# Patient Record
Sex: Female | Born: 1995 | Race: White | Hispanic: No | Marital: Single | Smoking: Never smoker
Health system: Southern US, Community
[De-identification: ages and names within clinical notes are randomized; demographics above are authoritative.]

## PROBLEM LIST (undated history)

## (undated) HISTORY — PX: WISDOM TOOTH EXTRACTION: SHX21

---

## 2014-02-13 ENCOUNTER — Emergency Department (HOSPITAL_BASED_OUTPATIENT_CLINIC_OR_DEPARTMENT_OTHER)
Admission: EM | Admit: 2014-02-13 | Discharge: 2014-02-13 | Disposition: A | Discharge status: Home / Self Care | Payer: No Typology Code available for payment source | Attending: Emergency Medicine | Admitting: Emergency Medicine

## 2014-02-13 ENCOUNTER — Encounter (HOSPITAL_BASED_OUTPATIENT_CLINIC_OR_DEPARTMENT_OTHER): Payer: Self-pay | Admitting: Emergency Medicine

## 2014-02-13 DIAGNOSIS — Y929 Unspecified place or not applicable: Secondary | ICD-10-CM | POA: Insufficient documentation

## 2014-02-13 DIAGNOSIS — T192XXA Foreign body in vulva and vagina, initial encounter: Secondary | ICD-10-CM | POA: Diagnosis not present

## 2014-02-13 DIAGNOSIS — Y939 Activity, unspecified: Secondary | ICD-10-CM | POA: Insufficient documentation

## 2014-02-13 DIAGNOSIS — IMO0002 Reserved for concepts with insufficient information to code with codable children: Secondary | ICD-10-CM | POA: Insufficient documentation

## 2014-02-13 NOTE — ED Provider Notes (Signed)
CSN: [id number]     Arrival date & time 02/13/14  1234 History   First MD Initiated Contact with Patient 02/13/14 1343     Chief Complaint  Patient presents with  . Foreign Body in Vagina     (Consider location/radiation/quality/duration/timing/severity/associated sxs/prior Treatment) Patient is a 18 y.o. female presenting with foreign body in vagina. The history is provided by the patient. No language interpreter was used.  Foreign Body in Vagina This is a new problem. The current episode started today. The problem occurs constantly. The problem has been gradually worsening. Pertinent negatives include no fever. Nothing aggravates the symptoms. She has tried nothing for the symptoms. The treatment provided moderate relief.    History reviewed. No pertinent past medical history. History reviewed. No pertinent past surgical history. No family history on file. History  Substance Use Topics  . Smoking status: Never Smoker   . Smokeless tobacco: Not on file  . Alcohol Use: Not on file   OB History   Grav Para Term Preterm Abortions TAB SAB Ect Mult Living                 Review of Systems  Constitutional: Negative for fever.  Genitourinary: Negative for vaginal bleeding, vaginal discharge and vaginal pain.  All other systems reviewed and are negative.     Allergies  Review of patient's allergies indicates no known allergies.  Home Medications   Prior to Admission medications   Not on File   BP 121/65  Pulse 67  Temp(Src) 98.3 F (36.8 C) (Oral)  Resp 19  Ht  (1.626 m)  Wt 135 lb (61.236 kg)  BMI 23.16 kg/m2  SpO2 100%  LMP 02/13/2014 Physical Exam  Nursing note and vitals reviewed. Constitutional: She is oriented to person, place, and time. She appears well-developed and well-nourished.  HENT:  Head: Normocephalic and atraumatic.  Eyes: Conjunctivae and EOM are normal. Pupils are equal, round, and reactive to light.  Neck: Normal range of motion.   Cardiovascular: Normal rate.   Pulmonary/Chest: Effort normal.  Abdominal: She exhibits no distension.  Genitourinary: No vaginal discharge found.  tampoon vault  Musculoskeletal: Normal range of motion.  Neurological: She is alert and oriented to person, place, and time.  Psychiatric: She has a normal mood and affect.    ED Course  Procedures (including critical care time) Labs Review Labs Reviewed - No data to display  Imaging Review No results found.   EKG Interpretation None      MDM  tmpoon removed with ring forcep   Final diagnoses:  Vaginal foreign body, initial encounter    Return if any problems.    Lonia Skinner Plover, PA-C 02/13/14 1437

## 2014-02-13 NOTE — ED Notes (Signed)
Patient here with tampon stuck in vagina x 1 day, denies pain

## 2014-02-13 NOTE — Discharge Instructions (Signed)
Vaginal Foreign Body °A vaginal foreign body is any object that gets stuck or left inside the vagina. Vaginal foreign bodies left in the vagina for a long time can cause irritation and infection. In most cases, symptoms go away once the vaginal foreign body is found and removed. Rarely, a foreign object can break through the walls of the vagina and cause a serious infection inside the abdomen.  °CAUSES  °The most common vaginal foreign bodies are: °· Tampons. °· Contraceptive devices. °· Toilet tissue left in the vagina. °· Small objects that were placed in the vagina out of curiosity and got stuck. °· A result of sexual abuse.  °SIGNS AND SYMPTOMS °· Light vaginal bleeding. °· Blood-tinged vaginal fluid (discharge). °· Vaginal discharge that smells bad. °· Vaginal itching or burning. °· Redness, swelling, or rash near the opening of the vagina. °· Abdominal pain. °· Fever. °· Burning or frequent urination. °DIAGNOSIS  °Your health care provider may be able to diagnose a vaginal foreign body based on the information you provide, your symptoms, and a physical exam. Your health care provider may also perform the following tests to check for infection: °· A swab of the discharge to check under a microscope for bacteria (culture). °· A urine culture. °· An examination of the vagina with a small, lighted scope (vaginoscopy). °· Imaging tests to get a picture of the inside of your vagina, such as: °¨ Ultrasound. °¨ X-ray. °¨ MRI. °TREATMENT  °In most cases, a vaginal foreign body can be easily removed and the symptoms usually go away very quickly. Other treatment may include:  °· If the vaginal foreign body is not easily removed, medicine may be given to make you go to sleep (general anesthesia) to have the object removed. °· Emergency surgery may be necessary if an infection spreads through the walls of the vagina into the abdomen (acute abdomen). This is rare. °· You may need to take antibiotic medicine if you have a  vaginal or urinary tract infection. °HOME CARE INSTRUCTIONS  °· Take medicines only as directed by your health care provider. °· If you were prescribed an antibiotic medicine, finish it all even if you start to feel better. °· Do not have sex or use tampons until your health care provider says it is okay. °· Do not douche or use vaginal rinses unless your health care provider recommends it. °· Keep all follow-up visits as directed by your health care provider. This is important. °SEEK MEDICAL CARE IF: °· You have abdominal pain or burning pain when urinating. °· You have a fever. °SEEK IMMEDIATE MEDICAL CARE IF: °· You have heavy vaginal bleeding or discharge.   °· You have very bad abdominal pain.   °MAKE SURE YOU: °· Understand these instructions. °· Will watch your condition. °· Will get help right away if you are not doing well or get worse. °Document Released: 10/05/2013 Document Reviewed: 03/20/2013 °[employer]® Patient Information ©2015 [employer]. This information is not intended to replace advice given to you by your health care provider. Make sure you discuss any questions you have with your health care provider. ° °

## 2014-02-13 NOTE — ED Provider Notes (Signed)
Medical screening examination/treatment/procedure(s) were performed by non-physician practitioner and as supervising physician I was immediately available for consultation/collaboration.   EKG Interpretation None       Maryjayne Kleven, MD 02/13/14 1520 

## 2014-02-13 NOTE — ED Notes (Signed)
Pt discharged to home with family. NAD.  

## 2016-08-25 ENCOUNTER — Emergency Department (HOSPITAL_BASED_OUTPATIENT_CLINIC_OR_DEPARTMENT_OTHER)
Admission: EM | Admit: 2016-08-25 | Discharge: 2016-08-25 | Disposition: A | Discharge status: Home / Self Care | Payer: BLUE CROSS/BLUE SHIELD | Attending: Emergency Medicine | Admitting: Emergency Medicine

## 2016-08-25 ENCOUNTER — Encounter (HOSPITAL_BASED_OUTPATIENT_CLINIC_OR_DEPARTMENT_OTHER): Payer: Self-pay | Admitting: Emergency Medicine

## 2016-08-25 DIAGNOSIS — Y9339 Activity, other involving climbing, rappelling and jumping off: Secondary | ICD-10-CM | POA: Diagnosis not present

## 2016-08-25 DIAGNOSIS — Y999 Unspecified external cause status: Secondary | ICD-10-CM | POA: Diagnosis not present

## 2016-08-25 DIAGNOSIS — W1789XA Other fall from one level to another, initial encounter: Secondary | ICD-10-CM | POA: Diagnosis not present

## 2016-08-25 DIAGNOSIS — Y929 Unspecified place or not applicable: Secondary | ICD-10-CM | POA: Insufficient documentation

## 2016-08-25 DIAGNOSIS — S0121XA Laceration without foreign body of nose, initial encounter: Secondary | ICD-10-CM | POA: Diagnosis present

## 2016-08-25 DIAGNOSIS — R040 Epistaxis: Secondary | ICD-10-CM

## 2016-08-25 NOTE — ED Provider Notes (Signed)
[facility]   CSN: [id number] Arrival date & time: 08/25/16  1345     History   Chief Complaint Chief Complaint  Patient presents with  . Facial Injury    HPI Rita Jenkins is a 21 y.o. female.  21 yo F with a chief complaint of pain to her nose. Patient was trying to climb a fence and fell and landed on her face. Denies neck pain denies loss consciousness denies confusion denies vomiting. Had some epistaxis that improved with direct pressure. Also is having a wound to her nose itself. Bleeding controlled with pressure.   The history is provided by the patient.  Facial Injury  Mechanism of injury:  Fall Location:  Nose Time since incident:  2 hours Pain details:    Quality:  Aching, sharp and burning   Severity:  Mild   Duration:  2 hours   Timing:  Constant   Progression:  Unchanged Foreign body present:  No foreign bodies Relieved by:  Nothing Worsened by:  Nothing Ineffective treatments:  None tried Associated symptoms: epistaxis   Associated symptoms: no congestion, no headaches, no nausea, no rhinorrhea, no vomiting and no wheezing     History reviewed. No pertinent past medical history.  There are no active problems to display for this patient.   Past Surgical History:  Procedure Laterality Date  . WISDOM TOOTH EXTRACTION      OB History    No data available       Home Medications    Prior to Admission medications   Medication Sig Start Date End Date Taking? Authorizing Provider  desogestrel-ethinyl estradiol (APRI,EMOQUETTE,SOLIA) 0.15-30 MG-MCG tablet Take 1 tablet by mouth daily.   Yes Historical Provider, MD    Family History No family history on file.  Social History Social History  Substance Use Topics  . Smoking status: Never Smoker  . Smokeless tobacco: Never Used  . Alcohol use No     Allergies   Patient has no known allergies.   Review of Systems Review of Systems  Constitutional: Negative for  chills and fever.  HENT: Positive for nosebleeds. Negative for congestion and rhinorrhea.   Eyes: Negative for redness and visual disturbance.  Respiratory: Negative for shortness of breath and wheezing.   Cardiovascular: Negative for chest pain and palpitations.  Gastrointestinal: Negative for nausea and vomiting.  Genitourinary: Negative for dysuria and urgency.  Musculoskeletal: Negative for arthralgias and myalgias.  Skin: Positive for wound. Negative for pallor.  Neurological: Negative for dizziness and headaches.     Physical Exam Updated Vital Signs BP 124/82 (BP Location: Left Arm)   Pulse 92   Temp 98.3 F (36.8 C) (Oral)   Resp 18   Ht 5\' 4"  (1.626 m)   Wt 125 lb (56.7 kg)   LMP 08/18/2016 (Approximate)   SpO2 100%   BMI 21.46 kg/m   Physical Exam  Constitutional: She is oriented to person, place, and time. She appears well-developed and well-nourished. No distress.  HENT:  Head: Normocephalic and atraumatic.  Superficial linear laceration 2.6 cm on the right nasal bridge. Trace blood to the anterior medial aspect of the left nare  Eyes: EOM are normal. Pupils are equal, round, and reactive to light.  Neck: Normal range of motion. Neck supple.  Cardiovascular: Normal rate and regular rhythm.  Exam reveals no gallop and no friction rub.   No murmur heard. Pulmonary/Chest: Effort normal. She has no wheezes. She has no rales.  Abdominal: Soft. She  exhibits no distension and no mass. There is no tenderness. There is no guarding.  Musculoskeletal: She exhibits no edema or tenderness.  Neurological: She is alert and oriented to person, place, and time.  Skin: Skin is warm and dry. She is not diaphoretic.  Psychiatric: She has a normal mood and affect. Her behavior is normal.  Nursing note and vitals reviewed.    ED Treatments / Results  Labs (all labs ordered are listed, but only abnormal results are displayed) Labs Reviewed - No data to display  EKG  EKG  Interpretation None       Radiology No results found.  Procedures .Marland KitchenLaceration Repair Date/Time: 08/25/2016 2:39 PM Performed by: Adela Lank Breon Rehm Authorized by: Melene Plan   Consent:    Consent obtained:  Verbal   Consent given by:  Patient   Risks discussed:  Infection, pain, poor cosmetic result and poor wound healing   Alternatives discussed:  No treatment, delayed treatment and observation Anesthesia (see MAR for exact dosages):    Anesthesia method:  None Laceration details:    Location:  Face   Face location:  Nose   Length (cm):  2.7 Repair type:    Repair type:  Simple Exploration:    Wound exploration: wound explored through full range of motion     Contaminated: no   Treatment:    Area cleansed with:  Soap and water   Amount of cleaning:  Standard Skin repair:    Repair method:  Tissue adhesive Approximation:    Approximation:  Close Post-procedure details:    Dressing:  Open (no dressing)   Patient tolerance of procedure:  Tolerated well, no immediate complications   (including critical care time)  Medications Ordered in ED Medications - No data to display   Initial Impression / Assessment and Plan / ED Course  I have reviewed the triage vital signs and the nursing notes.  Pertinent labs & imaging results that were available during my care of the patient were reviewed by me and considered in my medical decision making (see chart for details).     21 yo F With a laceration to her nose. Superficial in nature repaired with Dermabond. Clinically patient has no signs of fracture of the face. Discussed with the patient off imaging at this time. Patient has some very mild epistaxis that improved with direct pressure. Discharge home.  Final Clinical Impressions(s) / ED Diagnoses   Final diagnoses:  Nasal laceration, initial encounter  Epistaxis    New Prescriptions New Prescriptions   No medications on file     Melene Plan, DO 08/25/16 1442

## 2016-08-25 NOTE — ED Notes (Signed)
ED Provider at bedside. 

## 2016-08-25 NOTE — ED Triage Notes (Signed)
Pt tripped over a baracade approx 1 hour ago and landed on nose, nose bled, bleeing since has stopped.  Pt c/o pain to nose.  Ice pack applied by pt and she took 600mg  ibuprofen at approx 120pm today.

## 2016-08-27 ENCOUNTER — Emergency Department (HOSPITAL_BASED_OUTPATIENT_CLINIC_OR_DEPARTMENT_OTHER): Payer: BLUE CROSS/BLUE SHIELD

## 2016-08-27 ENCOUNTER — Encounter (HOSPITAL_BASED_OUTPATIENT_CLINIC_OR_DEPARTMENT_OTHER): Payer: Self-pay | Admitting: Emergency Medicine

## 2016-08-27 ENCOUNTER — Emergency Department (HOSPITAL_BASED_OUTPATIENT_CLINIC_OR_DEPARTMENT_OTHER)
Admission: EM | Admit: 2016-08-27 | Discharge: 2016-08-27 | Disposition: A | Discharge status: Home / Self Care | Payer: BLUE CROSS/BLUE SHIELD | Attending: Emergency Medicine | Admitting: Emergency Medicine

## 2016-08-27 DIAGNOSIS — S0992XA Unspecified injury of nose, initial encounter: Secondary | ICD-10-CM | POA: Diagnosis present

## 2016-08-27 DIAGNOSIS — S022XXA Fracture of nasal bones, initial encounter for closed fracture: Secondary | ICD-10-CM | POA: Diagnosis not present

## 2016-08-27 DIAGNOSIS — W1809XA Striking against other object with subsequent fall, initial encounter: Secondary | ICD-10-CM | POA: Diagnosis not present

## 2016-08-27 DIAGNOSIS — Y9389 Activity, other specified: Secondary | ICD-10-CM | POA: Insufficient documentation

## 2016-08-27 DIAGNOSIS — S0511XA Contusion of eyeball and orbital tissues, right eye, initial encounter: Secondary | ICD-10-CM | POA: Insufficient documentation

## 2016-08-27 DIAGNOSIS — Y999 Unspecified external cause status: Secondary | ICD-10-CM | POA: Diagnosis not present

## 2016-08-27 DIAGNOSIS — S0512XA Contusion of eyeball and orbital tissues, left eye, initial encounter: Secondary | ICD-10-CM | POA: Insufficient documentation

## 2016-08-27 DIAGNOSIS — Y929 Unspecified place or not applicable: Secondary | ICD-10-CM | POA: Insufficient documentation

## 2016-08-27 NOTE — ED Provider Notes (Signed)
[facility]   CSN: [id number] Arrival date & time: 08/27/16  1907  By signing my name below, I, Rita Jenkins, attest that this documentation has been prepared under the direction and in the presence of Rita CoreNathan Novie Maggio, MD . Electronically Signed: Teofilo PodMatthew P. Jenkins, ED Scribe. 08/27/2016. 8:06 PM.    History   Chief Complaint Chief Complaint  Patient presents with  . Facial Injury    The history is provided by the patient. No language interpreter was used.   HPI Comments:  Rita Jenkins is a 21 y.o. female who presents to the Emergency Department complaining of a wound to her nose that she sustained 3 days ago. 3 days ago, she fell and hit her face on the ground when trying to jump over a fence She states that the pain and swelling has worsened since the fall. Pt was seen here after the fall and had the wound repaired with dermabond. Pt denies any nasal discharge, epistaxis.  History reviewed. No pertinent past medical history.  There are no active problems to display for this patient.   Past Surgical History:  Procedure Laterality Date  . WISDOM TOOTH EXTRACTION      OB History    No data available       Home Medications    Prior to Admission medications   Medication Sig Start Date End Date Taking? Authorizing Provider  desogestrel-ethinyl estradiol (APRI,EMOQUETTE,SOLIA) 0.15-30 MG-MCG tablet Take 1 tablet by mouth daily.    Historical Provider, MD    Family History No family history on file.  Social History Social History  Substance Use Topics  . Smoking status: Never Smoker  . Smokeless tobacco: Never Used  . Alcohol use No     Allergies   Patient has no known allergies.   Review of Systems Review of Systems  HENT: Positive for facial swelling. Negative for nosebleeds.   Skin: Positive for wound.     Physical Exam Updated Vital Signs BP 130/69 (BP Location: Right Arm)   Pulse 79   Temp 98.7 F (37.1 C) (Oral)    Resp 16   Wt 125 lb (56.7 kg)   LMP 08/18/2016 (Approximate)   SpO2 100%   BMI 21.46 kg/m   Physical Exam  Constitutional: She appears well-developed and well-nourished. No distress.  HENT:  Head: Normocephalic and atraumatic.  Periorbital ecchymosis in both eyes.  Healing glued wound on the right side of nose. No tenderness over mid face. Mandible stable. Area of blood behind left TM, no tenderness or effusion.   Eyes: Conjunctivae and EOM are normal. Pupils are equal, round, and reactive to light.  Cardiovascular: Normal rate.   Pulmonary/Chest: Effort normal.  Abdominal: She exhibits no distension.  Neurological: She is alert.  Skin: Skin is warm and dry.  Psychiatric: She has a normal mood and affect.  Nursing note and vitals reviewed.    ED Treatments / Results  Labs (all labs ordered are listed, but only abnormal results are displayed) Labs Reviewed - No data to display  EKG  EKG Interpretation None       Radiology Dg Nasal Bones  Result Date: 08/27/2016 CLINICAL DATA:  Patient fell and hit face on jam 4. EXAM: NASAL BONES - 3+ VIEW COMPARISON:  None. FINDINGS: There is no evidence of fracture or other bone abnormality. IMPRESSION: Negative. Electronically Signed   By: Kennith CenterEric  Mansell M.D.   On: 08/27/2016 19:43   Ct Maxillofacial Wo Contrast  Result Date: 08/27/2016  CLINICAL DATA:  Tripped over very cage and landed on nose. EXAM: CT MAXILLOFACIAL WITHOUT CONTRAST TECHNIQUE: Multidetector CT imaging of the maxillofacial structures was performed. Multiplanar CT image reconstructions were also generated. A small metallic BB was placed on the right temple in order to reliably differentiate right from left. COMPARISON:  None. FINDINGS: Osseous: Age-indeterminate nasal bone fractures without substantial displacement. No associated maxillary sinus fracture. Zygomatic arches are intact. Temporomandibular joints are located. No mandible fracture. Orbits: No medial or inferior  orbital wall blowout fracture. Intra orbital fat is preserved bilaterally. 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 Otherwise unremarkable. Soft tissues: Negative. Limited intracranial: No significant or unexpected finding. IMPRESSION: Age-indeterminate minimally displaced fractures of the nasal bones. Otherwise unremarkable. Electronically Signed   By: Kennith Center M.D.   On: 08/27/2016 20:50    Procedures Procedures (including critical care time)  Medications Ordered in ED Medications - No data to display   Initial Impression / Assessment and Plan / ED Course  I have reviewed the triage vital signs and the nursing notes.  Pertinent labs & imaging results that were available during my care of the patient were reviewed by me and considered in my medical decision making (see chart for details).     Patient with fall 2 days ago. Had laceration closed that time. Now worried about nasal fracture. Has some bruising around her eyes. TMs does show some possible blood at one area behind it. States she does have chronic injury and scarring from previous ear issues. I do not know area of blood is new or old however CT scan done and did show small nasal fractures but no basilar skull fracture. Will discharge home to follow with ENT as needed.  Final Clinical Impressions(s) / ED Diagnoses   Final diagnoses:  Closed fracture of nasal bone, initial encounter    New Prescriptions Discharge Medication List as of 08/27/2016  9:00 PM    I personally performed the services described in this documentation, which was scribed in my presence. The recorded information has been reviewed and is accurate.       Rita Core, MD 08/27/16 2121

## 2016-08-27 NOTE — ED Notes (Signed)
Patient transported to X-ray 

## 2016-08-27 NOTE — ED Triage Notes (Signed)
Pt seen here Saturday for a facial injury from a fall. Pt states nose pain is worse. States she would like an xray. Pt has bruising noted under both eyes. Laceration to nose was glued on Saturday.

## 2017-06-10 IMAGING — CT CT MAXILLOFACIAL W/O CM
3 series · 16 of 47 positions shown, 19 images · non-contrast
Comparison: None.

CLINICAL DATA: Tripped over very cage and landed on nose.

EXAM:
CT MAXILLOFACIAL WITHOUT CONTRAST
TECHNIQUE: Multidetector CT imaging of the maxillofacial structures was
performed. Multiplanar CT image reconstructions were also generated.
A small metallic BB was placed on the right temple in order to
reliably differentiate right from left.

[Series 2: max soft · axial · 0.36mm/px · z∈[-202,-60]mm · 10 of 83 slices shown, 13 images]
[im 6/83  brain]
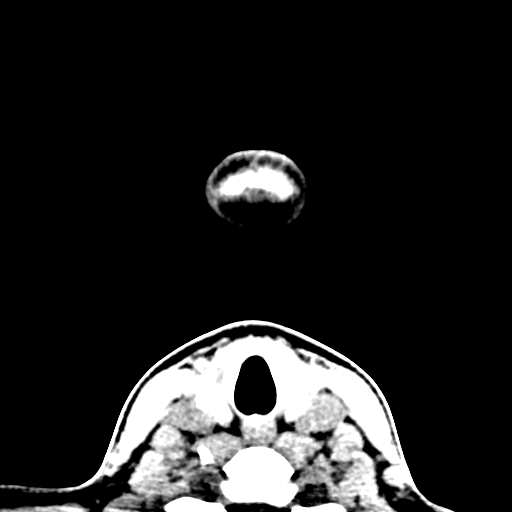
[im 6/83  bone]
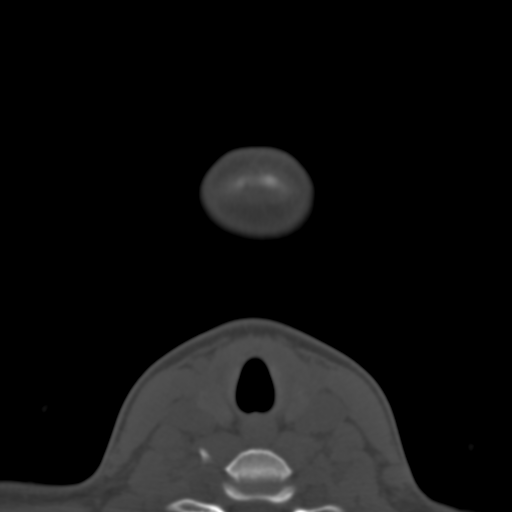
[im 15/83  bone]
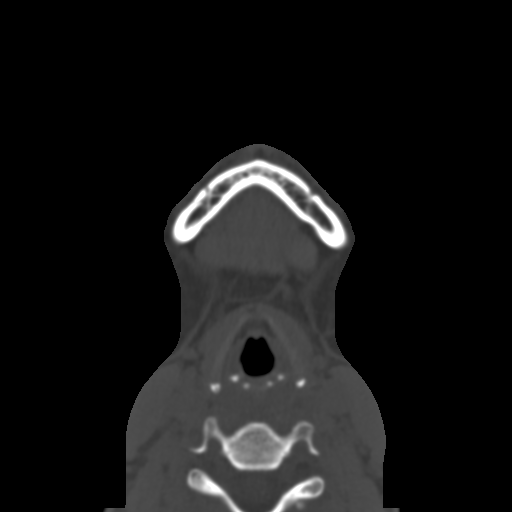
[im 23/83  bone]
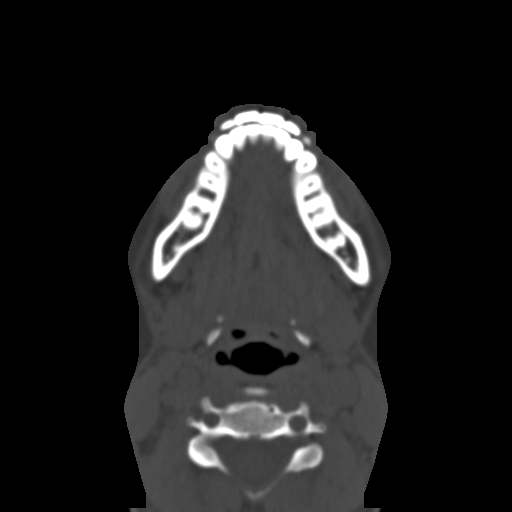
[im 29/83  bone]
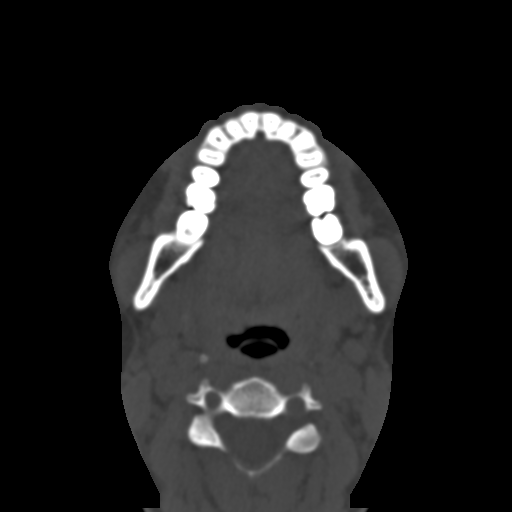
[im 37/83  brain]
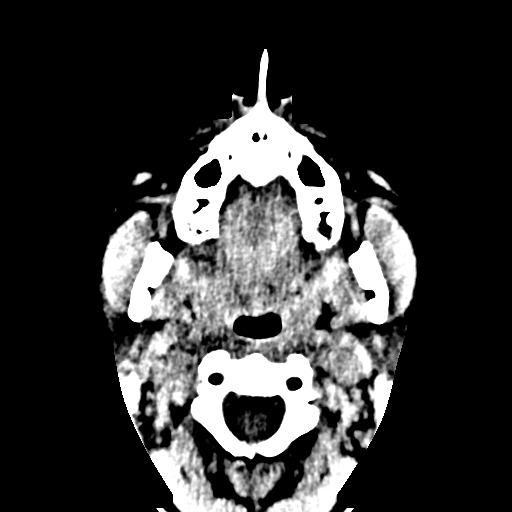
[im 37/83  bone]
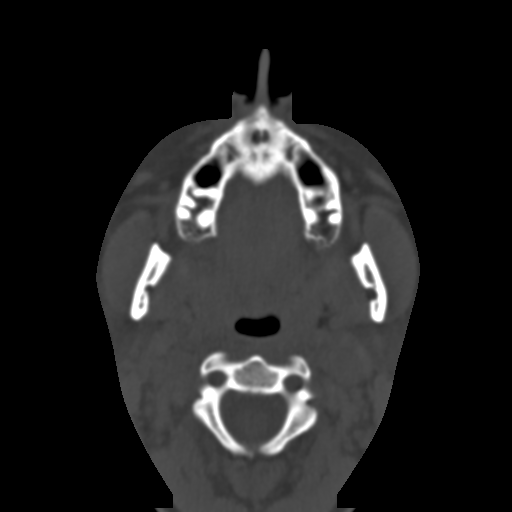
[im 46/83  bone]
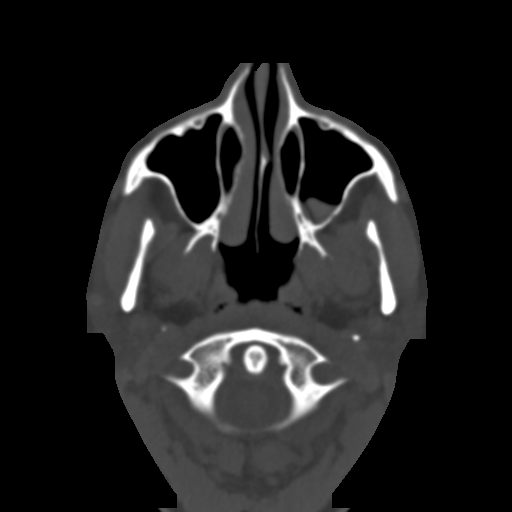
[im 54/83  bone]
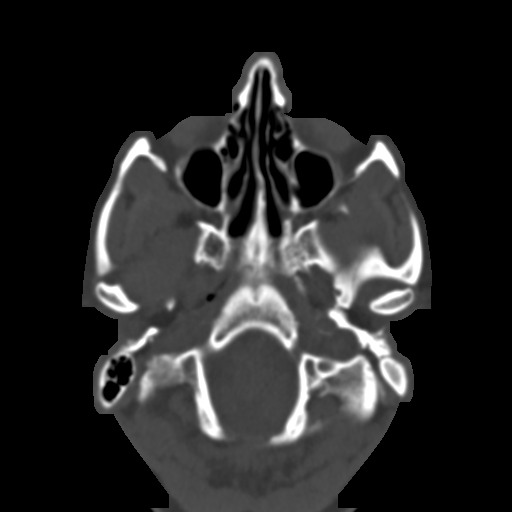
[im 63/83  bone]
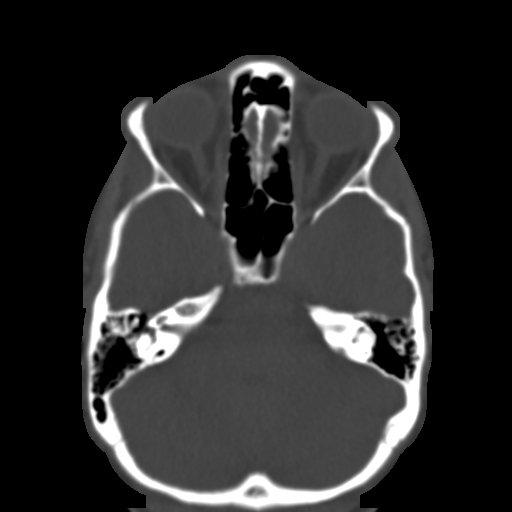
[im 68/83  brain]
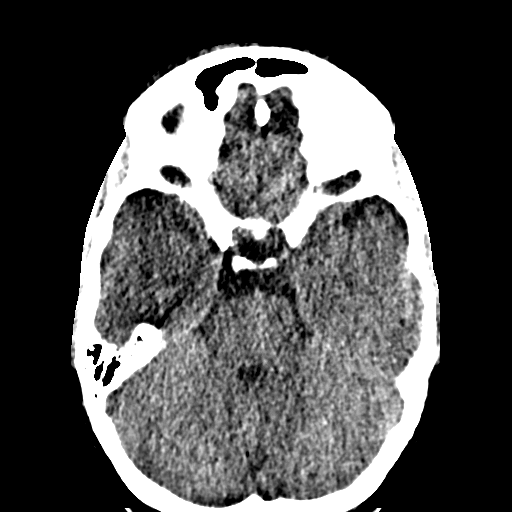
[im 68/83  bone]
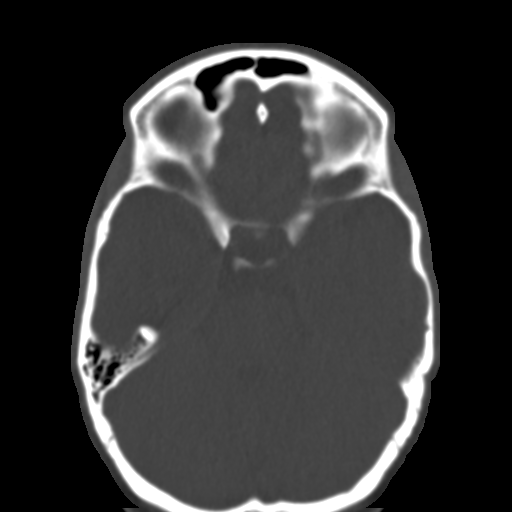
[im 77/83  bone]
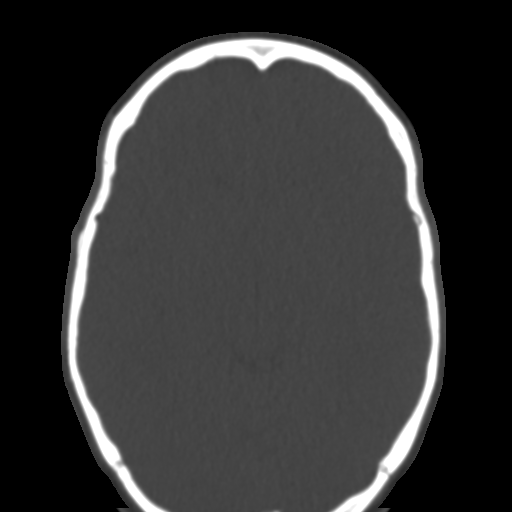

[Series 6: coronal soft · coronal · 0.36mm/px · 3 of 71 slices shown]
[im 24/71  bone]
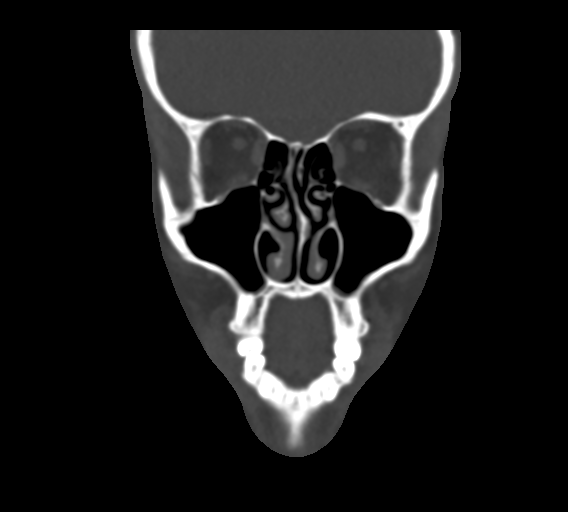
[im 32/71  bone]
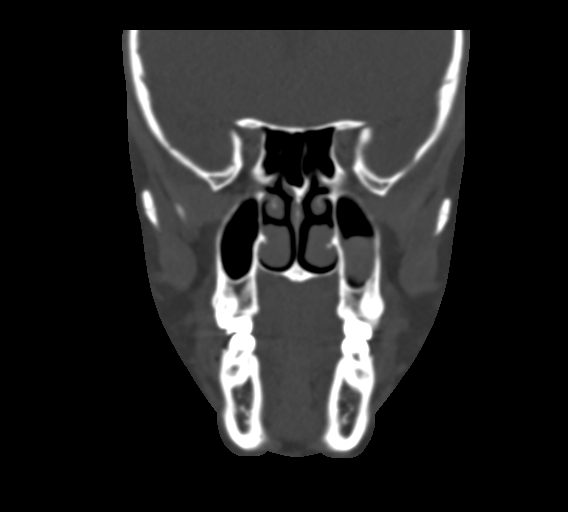
[im 39/71  bone]
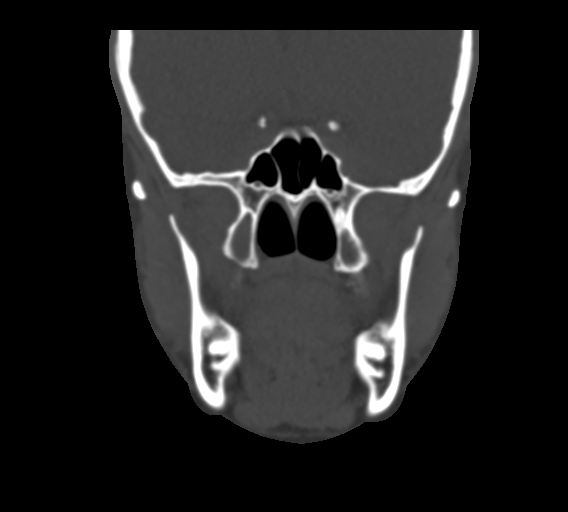

[Series 7: sagittal soft · sagittal · 0.37mm/px · 3 of 72 slices shown]
[im 24/72  bone]
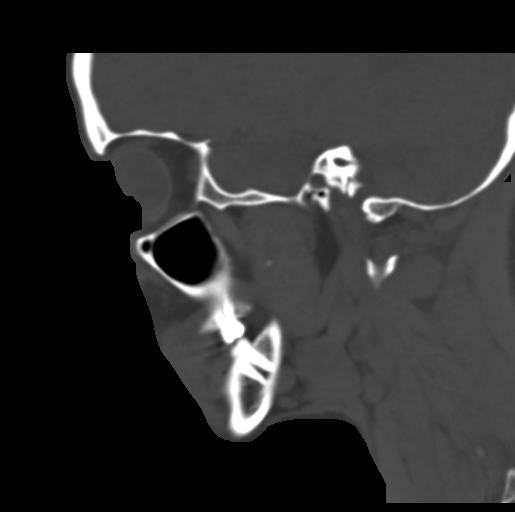
[im 36/72  bone]
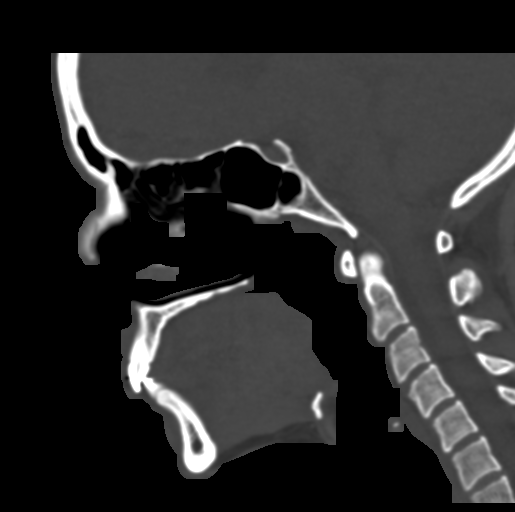
[im 48/72  bone]
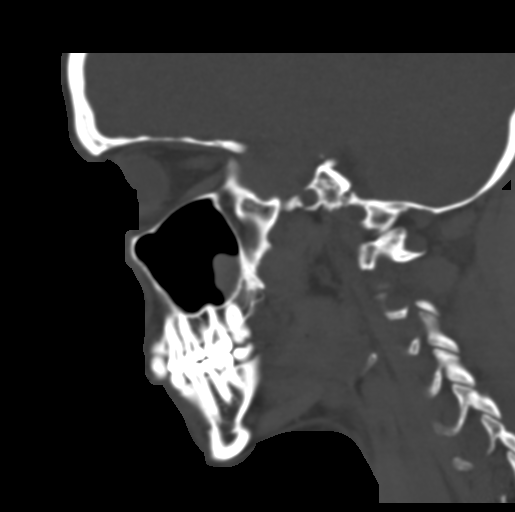

[16 of 47 positions shown; findings below may reference images not displayed]

FINDINGS: Osseous: Age-indeterminate nasal bone fractures without substantial
displacement. No associated maxillary sinus fracture. Zygomatic
arches are intact. Temporomandibular joints are located. No mandible
fracture.

Orbits: No medial or inferior orbital wall blowout fracture. Intra
orbital fat is preserved bilaterally.

Sinuses: Chronic mucosal disease identified left maxillary sinus.
Otherwise unremarkable.

Soft tissues: Negative.

Limited intracranial: No significant or unexpected finding.
IMPRESSION: Age-indeterminate minimally displaced fractures of the nasal bones.
Otherwise unremarkable.

## 2017-06-10 IMAGING — CR DG NASAL BONES 3+V
3 series · 3 of 3 positions shown · non-contrast
Comparison: None.

CLINICAL DATA: Patient fell and hit face on jam 4.

EXAM:
NASAL BONES - 3+ VIEW

[w waters *]
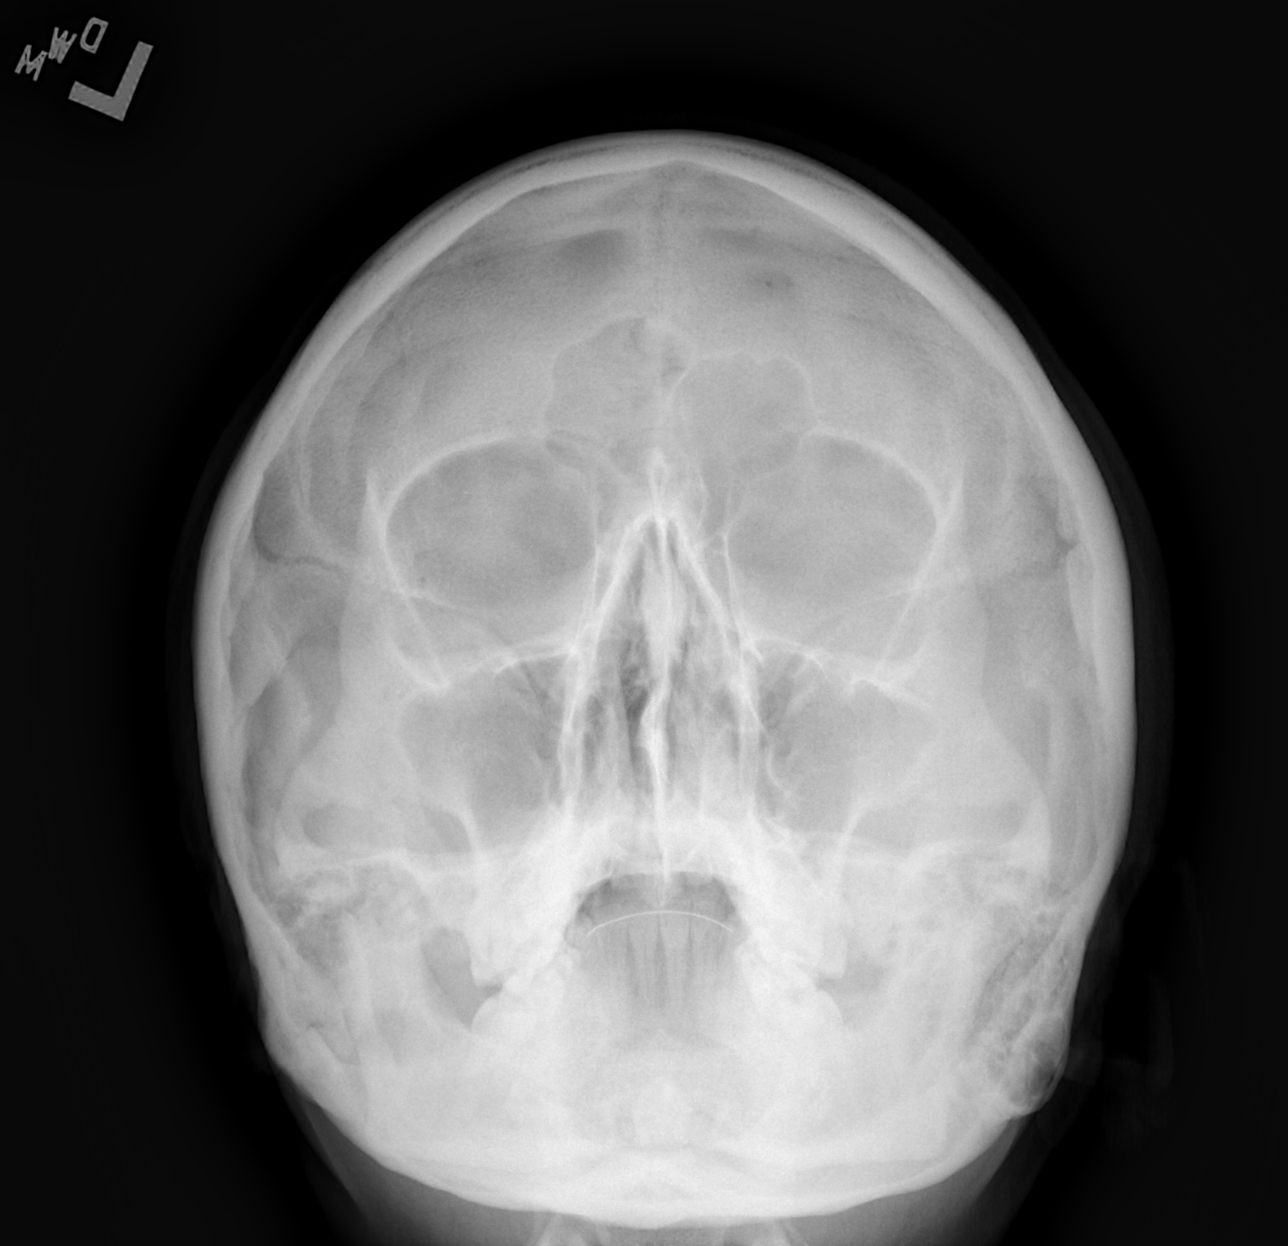

[w nasal bone lat * (1 of 2)]
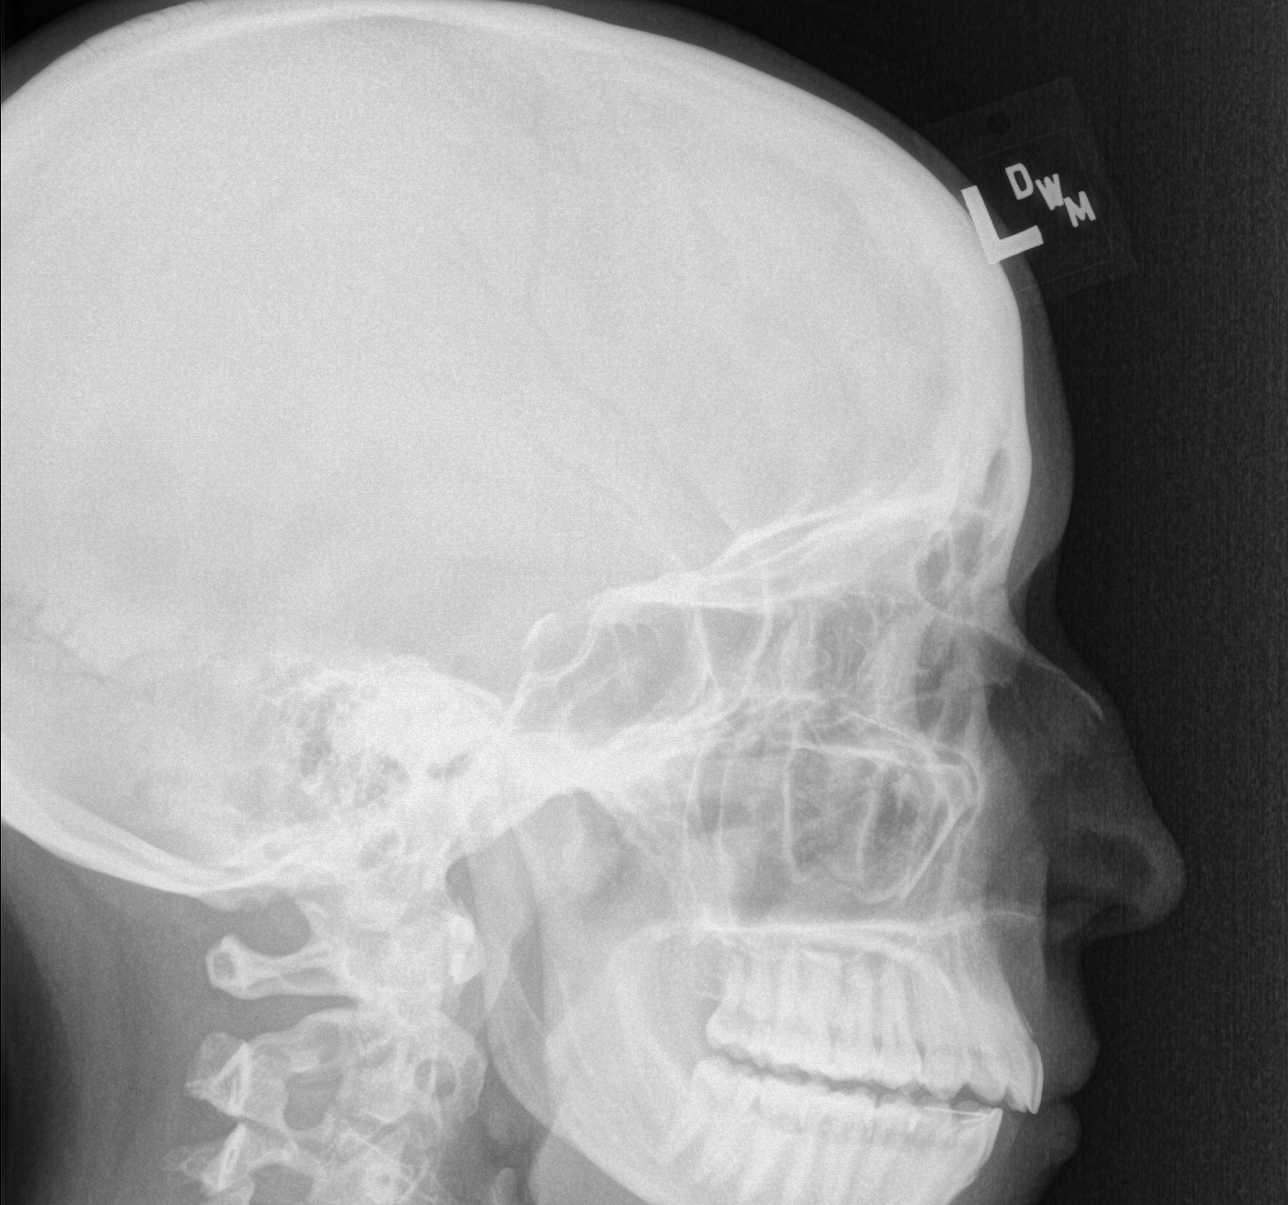

[w nasal bone lat * (2 of 2)]
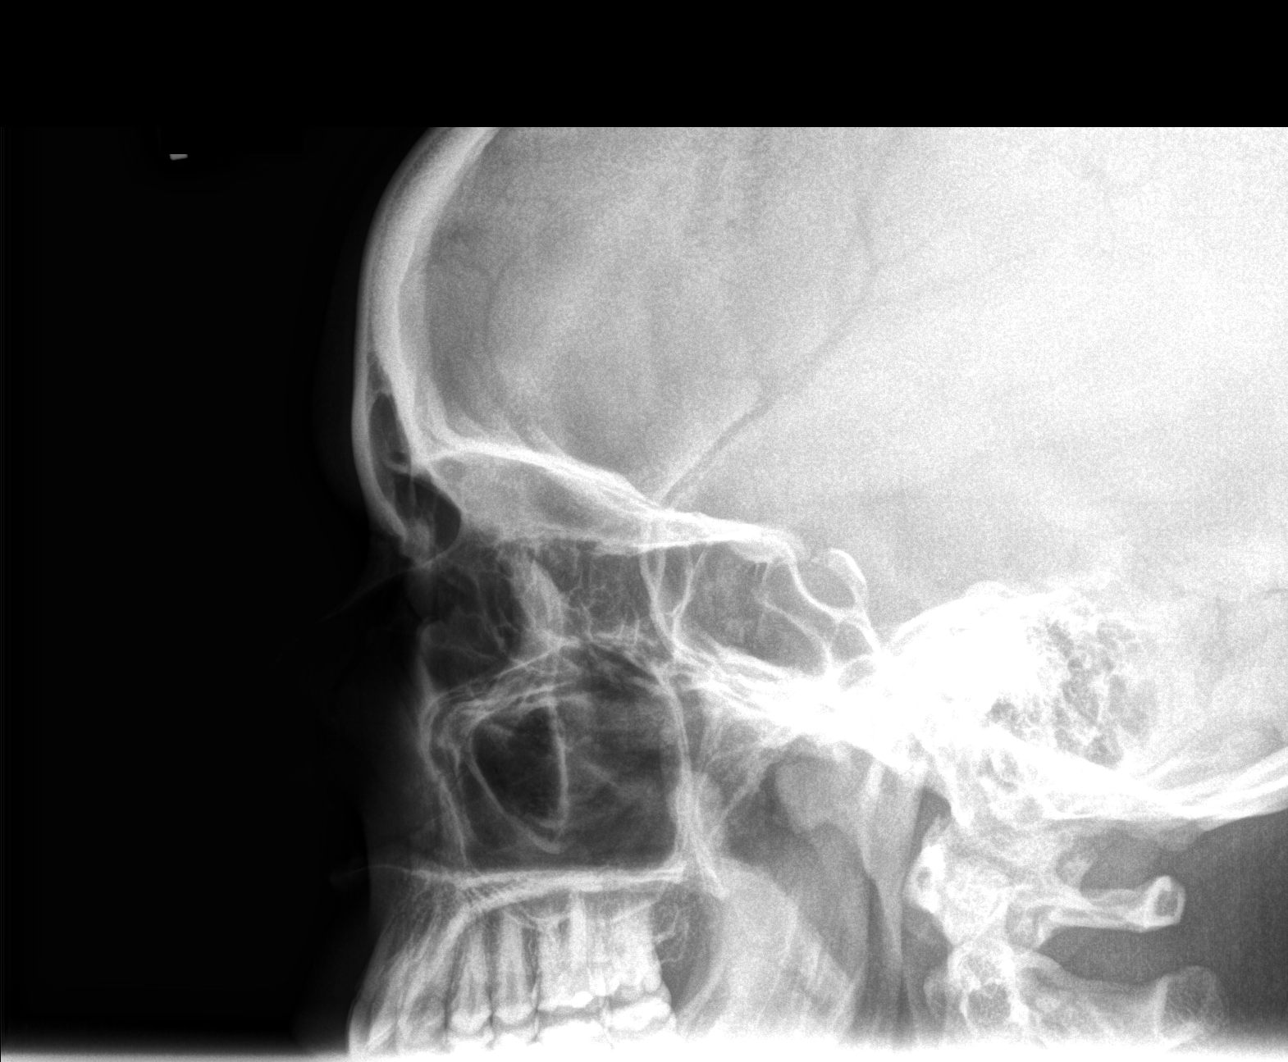

[3 of 3 positions shown; findings below may reference images not displayed]

FINDINGS: There is no evidence of fracture or other bone abnormality.
IMPRESSION: Negative.
